# Patient Record
Sex: Female | Born: 1935 | Race: White | Hispanic: No | Smoking: Former smoker
Health system: Southern US, Community
[De-identification: ages and names within clinical notes are randomized; demographics above are authoritative.]

---

## 2007-09-14 ENCOUNTER — Ambulatory Visit: Payer: Self-pay | Admitting: Ophthalmology

## 2007-10-12 ENCOUNTER — Ambulatory Visit: Payer: Self-pay | Admitting: Ophthalmology

## 2009-04-08 ENCOUNTER — Ambulatory Visit: Payer: Self-pay | Admitting: Ophthalmology

## 2011-09-08 ENCOUNTER — Ambulatory Visit: Payer: Self-pay | Admitting: Family Medicine

## 2013-08-26 ENCOUNTER — Emergency Department: Payer: Self-pay | Admitting: Emergency Medicine

## 2013-08-26 LAB — COMPREHENSIVE METABOLIC PANEL
Alkaline Phosphatase: 105 U/L (ref 50–136)
Anion Gap: 5 — ABNORMAL LOW (ref 7–16)
BUN: 19 mg/dL — ABNORMAL HIGH (ref 7–18)
Bilirubin,Total: 0.3 mg/dL (ref 0.2–1.0)
Calcium, Total: 8.8 mg/dL (ref 8.5–10.1)
Co2: 28 mmol/L (ref 21–32)
Creatinine: 1.45 mg/dL — ABNORMAL HIGH (ref 0.60–1.30)
EGFR (African American): 40 — ABNORMAL LOW
EGFR (Non-African Amer.): 35 — ABNORMAL LOW
Glucose: 132 mg/dL — ABNORMAL HIGH (ref 65–99)
Potassium: 4.4 mmol/L (ref 3.5–5.1)
Sodium: 140 mmol/L (ref 136–145)

## 2013-08-26 LAB — CBC
HCT: 38.9 % (ref 35.0–47.0)
MCH: 27.5 pg (ref 26.0–34.0)
MCHC: 32.2 g/dL (ref 32.0–36.0)
MCV: 85 fL (ref 80–100)
RBC: 4.56 10*6/uL (ref 3.80–5.20)
RDW: 15.7 % — ABNORMAL HIGH (ref 11.5–14.5)
WBC: 8.4 10*3/uL (ref 3.6–11.0)

## 2013-08-26 LAB — URINALYSIS, COMPLETE
Bacteria: NONE SEEN
Glucose,UR: NEGATIVE mg/dL (ref 0–75)
Ketone: NEGATIVE
Nitrite: NEGATIVE
Ph: 6 (ref 4.5–8.0)
Specific Gravity: 1.011 (ref 1.003–1.030)
Squamous Epithelial: 1
WBC UR: 203 /HPF (ref 0–5)

## 2013-08-26 LAB — TROPONIN I: Troponin-I: <0.02 ng/mL

## 2013-08-28 LAB — URINE CULTURE

## 2016-04-30 ENCOUNTER — Other Ambulatory Visit: Payer: Self-pay | Admitting: Family Medicine

## 2016-04-30 ENCOUNTER — Ambulatory Visit
Admission: RE | Admit: 2016-04-30 | Discharge: 2016-04-30 | Disposition: A | Discharge status: Home / Self Care | Payer: Medicare HMO | Source: Ambulatory Visit | Attending: Family Medicine | Admitting: Family Medicine

## 2016-04-30 DIAGNOSIS — M545 Low back pain: Secondary | ICD-10-CM | POA: Insufficient documentation

## 2016-04-30 DIAGNOSIS — M5137 Other intervertebral disc degeneration, lumbosacral region: Secondary | ICD-10-CM | POA: Diagnosis not present

## 2016-04-30 DIAGNOSIS — M549 Dorsalgia, unspecified: Secondary | ICD-10-CM

## 2017-04-06 ENCOUNTER — Other Ambulatory Visit: Payer: Self-pay | Admitting: Orthopedic Surgery

## 2017-04-06 DIAGNOSIS — M5441 Lumbago with sciatica, right side: Principal | ICD-10-CM

## 2017-04-06 DIAGNOSIS — G8929 Other chronic pain: Secondary | ICD-10-CM

## 2017-04-15 ENCOUNTER — Ambulatory Visit
Admission: RE | Admit: 2017-04-15 | Discharge: 2017-04-15 | Disposition: A | Discharge status: Home / Self Care | Payer: Medicare HMO | Source: Ambulatory Visit | Attending: Orthopedic Surgery | Admitting: Orthopedic Surgery

## 2017-04-15 ENCOUNTER — Ambulatory Visit: Payer: Medicare HMO

## 2017-04-15 DIAGNOSIS — I700 Atherosclerosis of aorta: Secondary | ICD-10-CM | POA: Insufficient documentation

## 2017-04-15 DIAGNOSIS — M5136 Other intervertebral disc degeneration, lumbar region: Secondary | ICD-10-CM | POA: Diagnosis not present

## 2017-04-15 DIAGNOSIS — R188 Other ascites: Secondary | ICD-10-CM | POA: Insufficient documentation

## 2017-04-15 DIAGNOSIS — N2889 Other specified disorders of kidney and ureter: Secondary | ICD-10-CM | POA: Diagnosis not present

## 2017-04-15 DIAGNOSIS — M549 Dorsalgia, unspecified: Secondary | ICD-10-CM | POA: Diagnosis present

## 2017-04-15 DIAGNOSIS — N281 Cyst of kidney, acquired: Secondary | ICD-10-CM | POA: Insufficient documentation

## 2017-04-15 DIAGNOSIS — G8929 Other chronic pain: Secondary | ICD-10-CM | POA: Diagnosis present

## 2017-04-15 DIAGNOSIS — M5441 Lumbago with sciatica, right side: Secondary | ICD-10-CM | POA: Diagnosis present

## 2017-04-15 DIAGNOSIS — M47896 Other spondylosis, lumbar region: Secondary | ICD-10-CM | POA: Insufficient documentation

## 2017-05-13 ENCOUNTER — Emergency Department: Payer: Medicare HMO

## 2017-05-13 ENCOUNTER — Emergency Department
Admission: EM | Admit: 2017-05-13 | Discharge: 2017-05-13 | Disposition: A | Discharge status: Home / Self Care | Payer: Medicare HMO | Attending: Student in an Organized Health Care Education/Training Program | Admitting: Student in an Organized Health Care Education/Training Program

## 2017-05-13 DIAGNOSIS — Y999 Unspecified external cause status: Secondary | ICD-10-CM | POA: Diagnosis not present

## 2017-05-13 DIAGNOSIS — M545 Low back pain: Secondary | ICD-10-CM | POA: Insufficient documentation

## 2017-05-13 DIAGNOSIS — Y939 Activity, unspecified: Secondary | ICD-10-CM | POA: Insufficient documentation

## 2017-05-13 DIAGNOSIS — Z87891 Personal history of nicotine dependence: Secondary | ICD-10-CM | POA: Diagnosis not present

## 2017-05-13 DIAGNOSIS — Y92009 Unspecified place in unspecified non-institutional (private) residence as the place of occurrence of the external cause: Secondary | ICD-10-CM | POA: Insufficient documentation

## 2017-05-13 DIAGNOSIS — M546 Pain in thoracic spine: Secondary | ICD-10-CM

## 2017-05-13 DIAGNOSIS — W010XXA Fall on same level from slipping, tripping and stumbling without subsequent striking against object, initial encounter: Secondary | ICD-10-CM | POA: Insufficient documentation

## 2017-05-13 DIAGNOSIS — W19XXXA Unspecified fall, initial encounter: Secondary | ICD-10-CM

## 2017-05-13 DIAGNOSIS — G8929 Other chronic pain: Secondary | ICD-10-CM

## 2017-05-13 MED ORDER — ACETAMINOPHEN 500 MG PO TABS
1000.0000 mg | ORAL_TABLET | Freq: Once | ORAL | Status: DC
  Filled 2017-05-13: qty 2

## 2017-05-13 MED ORDER — LIDOCAINE 5 % EX PTCH
1.0000 | MEDICATED_PATCH | CUTANEOUS | Status: DC
  Administered 2017-05-13: 1 via TRANSDERMAL
  Filled 2017-05-13: qty 1

## 2017-05-13 MED ORDER — ACETAMINOPHEN 500 MG PO TABS
1000.0000 mg | ORAL_TABLET | Freq: Once | ORAL | Status: AC
  Administered 2017-05-13: 1000 mg via ORAL

## 2017-05-13 MED ORDER — CYCLOBENZAPRINE HCL 10 MG PO TABS
5.0000 mg | ORAL_TABLET | Freq: Once | ORAL | Status: AC
  Administered 2017-05-13: 5 mg via ORAL
  Filled 2017-05-13: qty 1

## 2017-05-13 MED ORDER — LIDOCAINE 5 % EX PTCH: 1.0000 | MEDICATED_PATCH | Freq: Two times a day (BID) | CUTANEOUS | 0 refills | Status: AC

## 2017-05-13 MED ORDER — CYCLOBENZAPRINE HCL 10 MG PO TABS: 5.0000 mg | ORAL_TABLET | Freq: Once | ORAL | Status: DC

## 2017-05-13 MED ORDER — TRAMADOL HCL 50 MG PO TABS: 50.0000 mg | ORAL_TABLET | Freq: Once | ORAL | Status: DC

## 2017-05-13 NOTE — Care Management Note (Signed)
Case Management Note  Patient Details  Name: Shelia ConnersDorothy E James MRN: [id number] Date of Birth: 25-Jul-1935  Subjective/Objective:        [facility] She is sleepy and does not have a preference when asked. I have made a [facility] for her at [phone], and Lorene DyChristie has accepted it. They will put her on the schedule for later today. Yellow CM card placed on the chart , and MD and RN for the pt. Made aware.            Action/Plan:   Expected Discharge Date:                  Expected Discharge Plan:     In-House Referral:     Discharge planning Services     Post Acute Care Choice:    Choice offered to:     DME Arranged:    DME Agency:     HH Arranged:    HH Agency:     Status of Service:     If discussed at [employer] Length of Stay Meetings, dates discussed:    Additional Comments:  Berna BueCheryl Mayia Megill, RN 05/13/2017, 8:01 AM

## 2017-05-13 NOTE — ED Provider Notes (Signed)
Patient doing well with walker able to ambulate slowly no complaints of pain we'll discharge as planned   Arnaldo NatalMalinda, Miliani Deike F, MD 05/13/17 [phone]

## 2017-05-13 NOTE — ED Provider Notes (Signed)
[facility] Emergency Department Provider Note    First MD Initiated Contact with Patient 05/13/17 0451     (approximate)  I have reviewed the triage vital signs and the nursing notes.   HISTORY  Chief Complaint Fall    HPI Shelia James is a 82 y.o. female presents with chief complaint of low back pain after mechanical fall at her house. Patient denies hitting her head. There is no loss of consciousness. Patient does have a history of chronic low back pain for which she gets steroid injections. Says her last injection was 2 weeks ago. States that she was moving about her house. EMS reports that she appears to have reported a lot of items and have tripped over objects in the room.  Patient denies any numbness or tingling. Just complaining of low back pain without radiation.  Reports pain as moderate to severe.   History reviewed. No pertinent past medical history. History reviewed. No pertinent family history. History reviewed. No pertinent surgical history. There are no active problems to display for this patient.     Prior to Admission medications   Medication Sig Start Date End Date Taking? Authorizing Provider  lidocaine (LIDODERM) 5 % Place 1 patch onto the skin every 12 (twelve) hours. Remove & Discard patch within 12 hours or as directed by MD 05/13/17 05/13/18  Willy Eddyobinson, Mayah Urquidi, MD    Allergies Patient has no allergy information on record.    Social History Social History  Substance Use Topics  . Smoking status: Former Games developermoker  . Smokeless tobacco: Never Used  . Alcohol use No    Review of Systems Patient denies headaches, rhinorrhea, blurry vision, numbness, shortness of breath, chest pain, edema, cough, abdominal pain, nausea, vomiting, diarrhea, dysuria, fevers, rashes or hallucinations unless otherwise stated above in HPI. ____________________________________________   PHYSICAL EXAM:  VITAL SIGNS: Vitals:   05/13/17 0530  05/13/17 0600  BP: (!) 125/54 (!) 126/54  Pulse: (!) 58 62  Resp:    Temp:      Constitutional: Alert and oriented.  in no acute distress. Eyes: Conjunctivae are normal.  Head: Atraumatic. Nose: No congestion/rhinnorhea. Mouth/Throat: Mucous membranes are moist.   Neck: No stridor. Painless ROM.  Cardiovascular: Normal rate, regular rhythm. Grossly normal heart sounds.  Good peripheral circulation. Respiratory: Normal respiratory effort.  No retractions. Lungs CTAB. Gastrointestinal: Soft and nontender. No distention. No abdominal bruits. No CVA tenderness. Musculoskeletal: No lower extremity tenderness,  2+ bilateral edema.  No joint effusions.  TTP along upper lumbar and paralumbar region  No ecchymosis or crepitus Neurologic:  Normal speech and language. No gross focal neurologic deficits are appreciated. No facial droop Skin:  Skin is warm, dry and intact. No rash noted. Psychiatric: Mood and affect are normal. Speech and behavior are normal.  ____________________________________________   LABS (all labs ordered are listed, but only abnormal results are displayed)  No results found for this or any previous visit (from the past 24 hour(s)). ____________________________________________ ____________________________________________  RADIOLOGY  I personally reviewed all radiographic images ordered to evaluate for the above acute complaints and reviewed radiology reports and findings.  These findings were personally discussed with the patient.  Please see medical record for radiology report.  ____________________________________________   PROCEDURES  Procedure(s) performed:  Procedures    Critical Care performed: no ____________________________________________   INITIAL IMPRESSION / ASSESSMENT AND PLAN / ED COURSE  Pertinent labs & imaging results that were available during my care of the patient were reviewed  by me and considered in my medical decision making (see  chart for details).  DDX: fracture, contusion, spasm, dislocation  Shelia James is a 82 y.o. who presents to the ED with mechanical fall. No head injury. Patient with history of chronic back pain and is reporting worsening lower thoracic and lumbar back pain. Patient denies any chest pain or shortness of breath. No syncopal or presyncopal symptoms. We'll order plain radiographs to evaluate for fracture. Patient otherwise well appearing and in no acute distress.  Clinical Course as of May 13 709  Thu May 13, 2017  [id number] Reassessment patient states she does feel better after medications given thus far however is still having some discomfort when taking a deep breath in her low back. Based on her age and limited evaluation of ribs on chest x-ray will order CT imaging to evaluate for evidence of rib fractures.  [PR]  0708 CT imaging pending. Patient will be signed out to Dr. Juliette AlcideMelinda pending ambulation trial. Patient has been given referral to home health for home PT.  Have discussed with the patient and available family all diagnostics and treatments performed thus far and all questions were answered to the best of my ability. The patient demonstrates understanding and agreement with plan.   [PR]    Clinical Course User Index [PR] Willy Eddyobinson, Guliana Weyandt, MD     ____________________________________________   FINAL CLINICAL IMPRESSION(S) / ED DIAGNOSES  Final diagnoses:  Fall, initial encounter  Chronic midline thoracic back pain      NEW MEDICATIONS STARTED DURING THIS VISIT:  New Prescriptions   LIDOCAINE (LIDODERM) 5 %    Place 1 patch onto the skin every 12 (twelve) hours. Remove & Discard patch within 12 hours or as directed by MD     Note:  This document was prepared using Dragon voice recognition software and may include unintentional dictation errors.    Willy Eddyobinson, Demarie Uhlig, MD 05/13/17 [phone]

## 2017-05-13 NOTE — ED Triage Notes (Signed)
PT arrived via EMS from home. EMS stated that she lost her balance in the kitchen and fell hitting her back. Lower lumbar pain. Pt states that 2wks ago she had a cortisone shot in the same area. Pt states she did not hit her head. No IV. BP-140s/70s HR-78 O2-98%RA. NKA. Hx of COPD and CHF. Medications are bedside in a black bag with a zipper.

## 2017-05-13 NOTE — ED Notes (Signed)
Pt ambulated in hall with [place], [place]. Pt gait slow but steady with walker.

## 2017-05-17 ENCOUNTER — Encounter: Payer: Self-pay | Admitting: Emergency Medicine

## 2017-05-17 ENCOUNTER — Emergency Department
Admission: EM | Admit: 2017-05-17 | Discharge: 2017-05-17 | Disposition: A | Discharge status: Home / Self Care | Payer: Medicare HMO | Attending: Emergency Medicine | Admitting: Emergency Medicine

## 2017-05-17 DIAGNOSIS — Z79899 Other long term (current) drug therapy: Secondary | ICD-10-CM | POA: Diagnosis not present

## 2017-05-17 DIAGNOSIS — M545 Low back pain, unspecified: Secondary | ICD-10-CM

## 2017-05-17 DIAGNOSIS — Z87891 Personal history of nicotine dependence: Secondary | ICD-10-CM | POA: Diagnosis not present

## 2017-05-17 DIAGNOSIS — M62830 Muscle spasm of back: Secondary | ICD-10-CM | POA: Diagnosis not present

## 2017-05-17 MED ORDER — DIAZEPAM 5 MG PO TABS
5.0000 mg | ORAL_TABLET | Freq: Once | ORAL | Status: AC
  Administered 2017-05-17: 5 mg via ORAL
  Filled 2017-05-17: qty 1

## 2017-05-17 MED ORDER — PREDNISONE 10 MG (21) PO TBPK: ORAL_TABLET | ORAL | 0 refills | Status: AC

## 2017-05-17 MED ORDER — LIDOCAINE HCL (PF) 1 % IJ SOLN
10.0000 mL | Freq: Once | INTRAMUSCULAR | Status: AC
  Administered 2017-05-17: 10 mL via INTRADERMAL
  Filled 2017-05-17: qty 10

## 2017-05-17 MED ORDER — FENTANYL CITRATE (PF) 100 MCG/2ML IJ SOLN
50.0000 ug | Freq: Once | INTRAMUSCULAR | Status: AC
  Administered 2017-05-17: 50 ug via INTRAMUSCULAR
  Filled 2017-05-17: qty 2

## 2017-05-17 MED ORDER — TRIAMCINOLONE ACETONIDE 40 MG/ML IJ SUSP
80.0000 mg | Freq: Once | INTRAMUSCULAR | Status: AC
  Administered 2017-05-17: 80 mg via INTRAMUSCULAR
  Filled 2017-05-17 (×2): qty 2

## 2017-05-17 MED ORDER — TRAMADOL HCL 50 MG PO TABS: 50.0000 mg | ORAL_TABLET | Freq: Four times a day (QID) | ORAL | 0 refills | Status: AC | PRN

## 2017-05-17 MED ORDER — DIAZEPAM 2 MG PO TABS
2.0000 mg | ORAL_TABLET | Freq: Once | ORAL | Status: AC
  Administered 2017-05-17: 2 mg via ORAL
  Filled 2017-05-17: qty 1

## 2017-05-17 MED ORDER — DEXAMETHASONE SODIUM PHOSPHATE 10 MG/ML IJ SOLN
10.0000 mg | Freq: Once | INTRAMUSCULAR | Status: AC
  Administered 2017-05-17: 10 mg via INTRAMUSCULAR
  Filled 2017-05-17: qty 1

## 2017-05-17 NOTE — ED Provider Notes (Signed)
[facility] Emergency Department Provider Note   ____________________________________________   I have reviewed the triage vital signs and the nursing notes.   HISTORY  Chief Complaint Back Pain    HPI Shelia James is a 82 y.o. female presents to the emergency department with left-sided lumbar pain and severe muscle spasms that have significantly worsened since she was seen on 8/2. Patient reports sustained pain and spasming that increases with movement and walking posture. Patient denies any additional injuries or falls since being seen on 8/2. Patient describes pain as sharp, cramping and movement limiting. Patient reports being given a prescription for Lidoderm patches for current symptoms however she was unable to afford the prescription due to Lidoderm is not covered under her current prescription plan. Patient denies any radicular symptoms, bowel or bladder dysfunction or saddle anesthesia.   Patient denies fever, chills, headache, vision changes, chest pain, chest tightness, shortness of breath, abdominal pain, nausea and vomiting.  History reviewed. No pertinent past medical history.  There are no active problems to display for this patient.   History reviewed. No pertinent surgical history.  Prior to Admission medications   Medication Sig Start Date End Date Taking? Authorizing Provider  hydrOXYzine (VISTARIL) 25 MG capsule Take 25 mg by mouth 2 (two) times daily.    [provider]  lidocaine (LIDODERM) 5 % Place 1 patch onto the skin every 12 (twelve) hours. Remove & Discard patch within 12 hours or as directed by MD 05/13/17 05/13/18  Willy Eddy, MD  oxybutynin (DITROPAN) 5 MG tablet Take 5 mg by mouth 3 (three) times daily.    [provider]  predniSONE (STERAPRED UNI-PAK 21 TAB) 10 MG (21) TBPK tablet Take 6 tablets on day 1. Take 5 tablets on day 2. Take 4 tablets on day 3. Take 3 tablets on day 4. Take 2 tablets  on day 5. Take 1 tablets on day 6. 05/17/17   Tiwanna Tuch M, PA-C  rOPINIRole (REQUIP) 2 MG tablet Take 2 mg by mouth 2 (two) times daily.    [provider]  tiotropium (SPIRIVA) 18 MCG inhalation capsule Place 18 mcg into inhaler and inhale daily.    [provider]  traMADol (ULTRAM) 50 MG tablet Take 1 tablet (50 mg total) by mouth every 6 (six) hours as needed. 05/17/17 05/17/18  Eldred Sooy M, PA-C  vitamin B-12 (CYANOCOBALAMIN) 1000 MCG tablet Take 1,000 mcg by mouth daily.    [provider]    Allergies Patient has no known allergies.  History reviewed. No pertinent family history.  Social History Social History  Substance Use Topics  . Smoking status: Former Games developer  . Smokeless tobacco: Never Used  . Alcohol use No     Review of Systems Constitutional: Negative for fever/chills Eyes: No visual changes. ENT:  Negative for sore throat and for difficulty swallowing Cardiovascular: Denies chest pain. Respiratory: Denies cough. Denies shortness of breath. Gastrointestinal: No abdominal pain.  No nausea, vomiting, diarrhea. Genitourinary: Negative for dysuria. Musculoskeletal: Positive for right side lower thoracic and upper lumbar pain with severe muscle spasms.  Skin: Negative for rash. Neurological: Negative for headaches. Able to ambulate.   ____________________________________________   PHYSICAL EXAM:  VITAL SIGNS:  Patient Vitals for the past 24 hrs:  BP Temp Temp src Pulse Resp SpO2 Weight  05/17/17 1913 (!) 140/55 - - 70 16 90 % -  05/17/17 1439 (!) 130/49 97.6 F (36.4 C) Oral 75 16 97 % 104.3 kg (  230 lb)    Constitutional: Alert and oriented. Well appearing and in no acute distress.  Eyes: Conjunctivae are normal. PERRL. EOMI  Head: Normocephalic and atraumatic. ENT: Ears:Canals clear. TMs intact bilaterally. Nose: No congestion/rhinnorhea. Mouth/Throat: Mucous membranes are moist. Neck:Supple. No  thyromegaly. No stridor. Cardiovascular: Normal rate, regular rhythm. Normal S1 and S2. Good peripheral circulation. Respiratory: Normal respiratory effort without tachypnea or retractions. Lungs CTAB. Good air entry to the bases with no decreased or absent breath sounds. Hematological/Lymphatic/Immunological: No cervical lymphadenopathy. Cardiovascular: Normal rate, regular rhythm. Normal distal pulses. Respiratory: Normal respiratory effort. No wheezes/rales/rhonchi. Lungs CTAB with no W/R/R. Gastrointestinal: Bowel sounds 4 quadrants. Soft and nontender to palpation. No guarding or rigidity. No palpable masses. No distention. No CVA tenderness. Musculoskeletal: Lower thoracic and upper lumbar pain with palpable paraspinal muscle tenderness. Palpable spasms. Antalgic posturing secondary to pain and spasms. Nontender with normal range of motion in all extremities. Neurologic: Normal speech and language. No gross focal neurologic deficits are appreciated. No gait instability. No sensory loss or abnormal reflexes.  Skin:  Skin is warm, dry and intact. No rash noted. Psychiatric:Mood and affect are normal. Speech and behavior are normal. Patient exhibits  ____________________________________________   LABS (all labs ordered are listed, but only abnormal results are displayed)  Labs Reviewed - No data to display ____________________________________________  EKG none ____________________________________________  RADIOLOGY none ____________________________________________   PROCEDURES  Procedure(s) performed:  Trigger point injections:  Performed by: Clois Comber Consent: Verbal consent obtained. Risks and benefits: risks, benefits and alternatives were discussed  Kenalog 40 mg/ml and Lidocaine 2% 5 ml: 6 ml total on each side of thoracic longissimus. (3) trigger point injections along each side chosen based on palpation of trigger points.  Patient tolerance: Patient tolerated  the procedure well with no immediate complications.    Critical Care performed: no ____________________________________________   INITIAL IMPRESSION / ASSESSMENT AND PLAN / ED COURSE  Pertinent labs & imaging results that were available during my care of the patient were reviewed by me and considered in my medical decision making (see chart for details).    Patient presents to emergency department with lower thoracic and upper lumbar pain with severe muscle spasms without additional traumatic injury.  History and physical exam findings are reassuring symptoms are consistent with thoracic and lumbar strain with muscle spasms.. Patient noted improvement of symptoms and improved ease of mobility following treatment provided during the course of care in the emergency department. Patient will be prescribed  prednisone taper and tramadol pain as needed. Patient advised to follow up with PCP as needed or return to the emergency department if symptoms return or worsen.    ____________________________________________   FINAL CLINICAL IMPRESSION(S) / ED DIAGNOSES  Final diagnoses:  Muscle spasm of back  Acute midline low back pain without sciatica       NEW MEDICATIONS STARTED DURING THIS VISIT:  Discharge Medication List as of 05/17/2017  6:59 PM    START taking these medications   Details  predniSONE (STERAPRED UNI-PAK 21 TAB) 10 MG (21) TBPK tablet Take 6 tablets on day 1. Take 5 tablets on day 2. Take 4 tablets on day 3. Take 3 tablets on day 4. Take 2 tablets on day 5. Take 1 tablets on day 6., Print    traMADol (ULTRAM) 50 MG tablet Take 1 tablet (50 mg total) by mouth every 6 (six) hours as needed., Starting Mon 05/17/2017, Until Tue 05/17/2018, Print  Note:  This document was prepared using Dragon voice recognition software and may include unintentional dictation errors.    Jahvier Aldea, Jordan Likesraci M, PA-C 05/17/17 2223    Sharyn CreamerQuale, Mark, MD 05/18/17 [id number]

## 2017-05-17 NOTE — Discharge Instructions (Signed)
Return to emergency department if symptoms worsen and follow-up with PCP as needed.   °

## 2017-05-17 NOTE — ED Triage Notes (Signed)
Pt to ed with c/o back pain since falling about 4 days ago.  Pt reports she was seen here in ed but was given pain patch RX that cost 100$ out of pocket for her.  Pt reports pain is worse with movement and standing.

## 2017-05-17 NOTE — ED Notes (Signed)
Pt states she was seen in ED last week after a fall, states not feeling any better, states general back pain and tailbone pain, awake and alert, family at bedside

## 2017-09-09 IMAGING — CT CT CHEST W/O CM
2 of 3 series · 15 of 36 positions shown, 18 images · non-contrast
Comparison: Radiography same day.  MRI 04/15/2017

CLINICAL DATA: Fell at home in the kitchen striking the back.
Question chest wall injury.

EXAM:
CT CHEST WITHOUT CONTRAST
TECHNIQUE: Multidetector CT imaging of the chest was performed following the
standard protocol without IV contrast.

[Series 2: thorax · axial · 0.65mm/px · z∈[-874,-574]mm · 12 of 176 slices shown, 15 images]
[im 13/176  mediastinal]
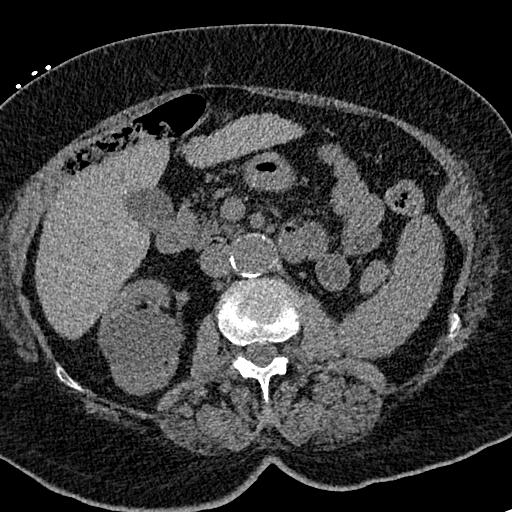
[im 13/176  lung]
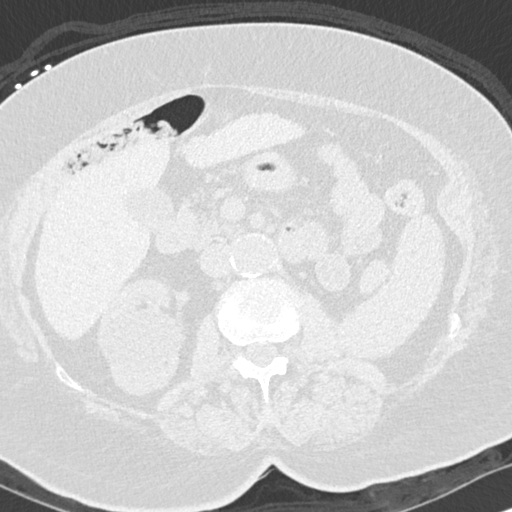
[im 26/176  lung]
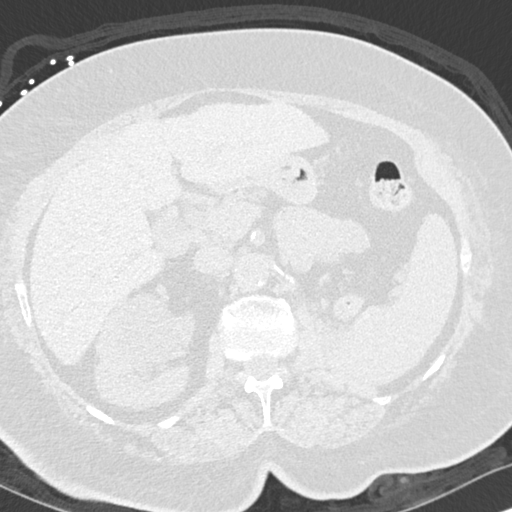
[im 39/176  lung]
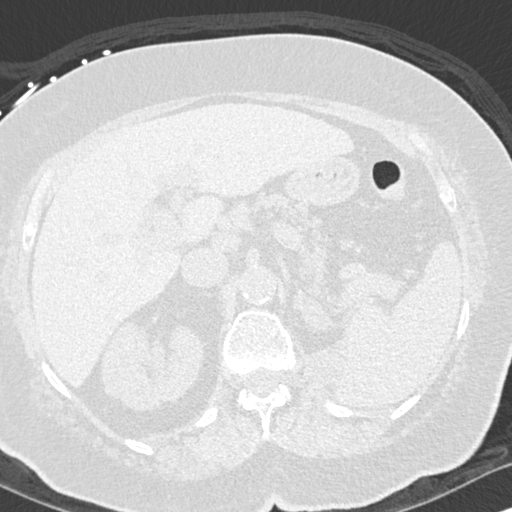
[im 52/176  lung]
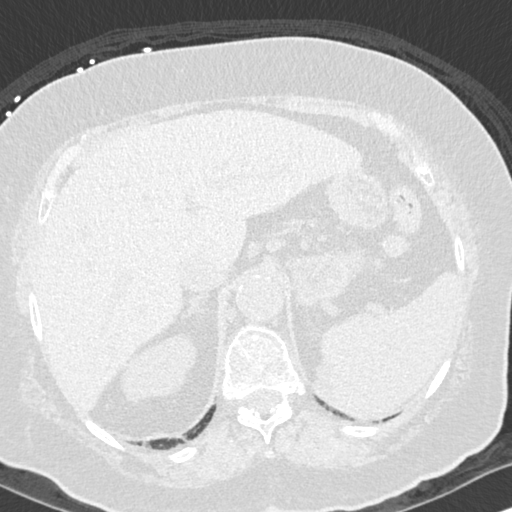
[im 65/176  mediastinal]
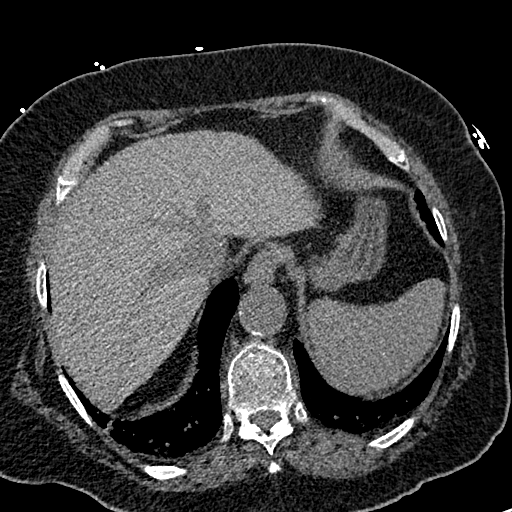
[im 65/176  lung]
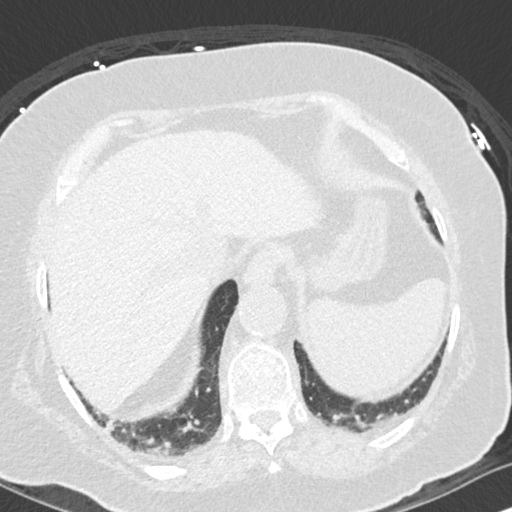
[im 78/176  lung]
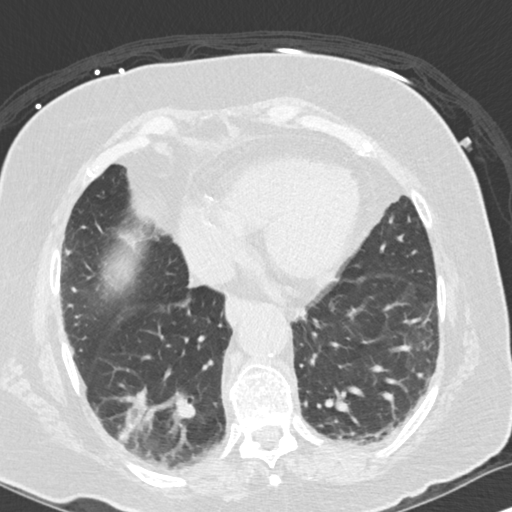
[im 98/176  lung]
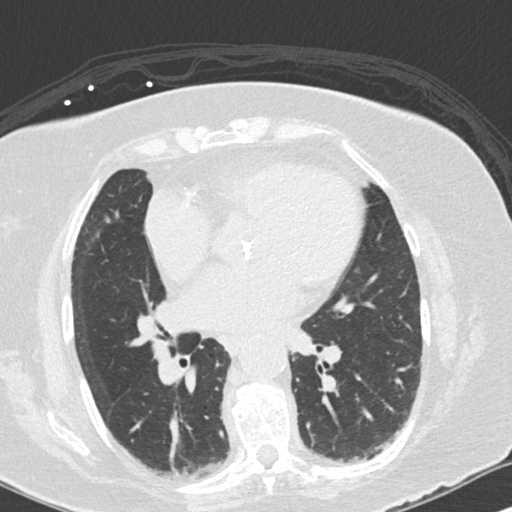
[im 111/176  lung]
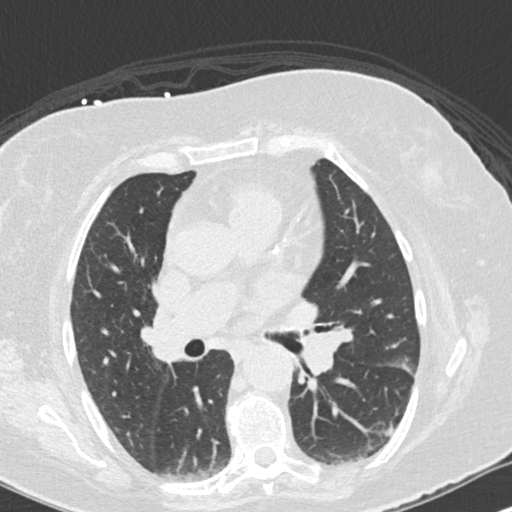
[im 124/176  mediastinal]
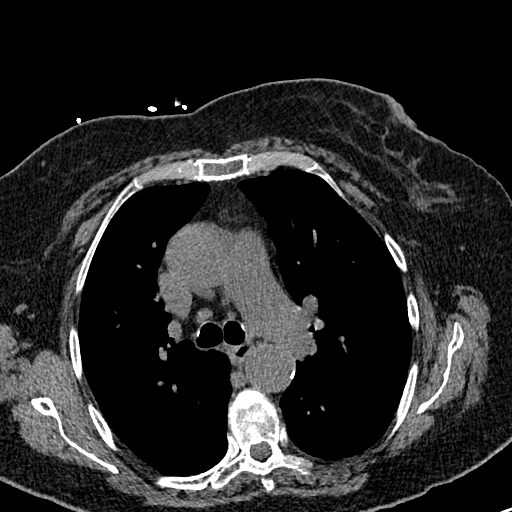
[im 124/176  lung]
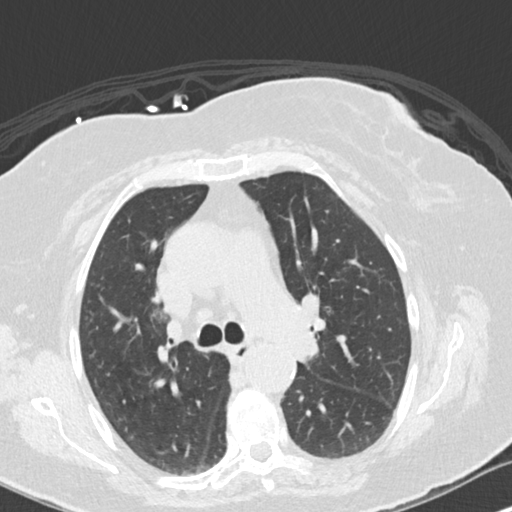
[im 137/176  lung]
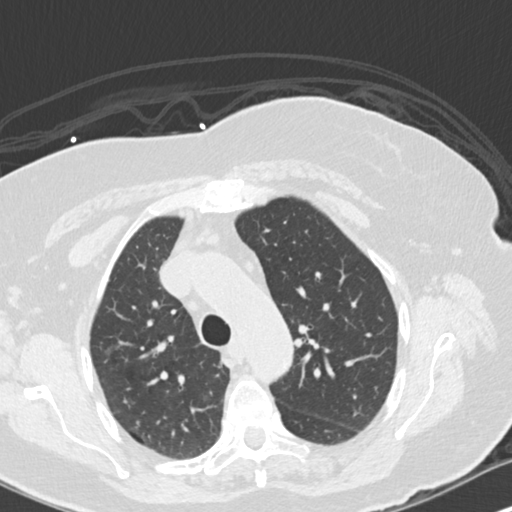
[im 150/176  lung]
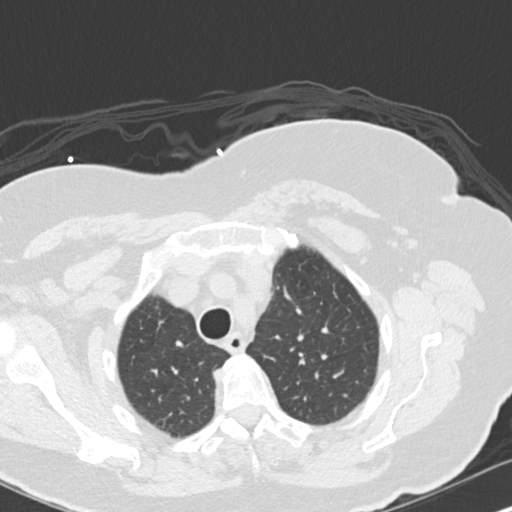
[im 163/176  lung]
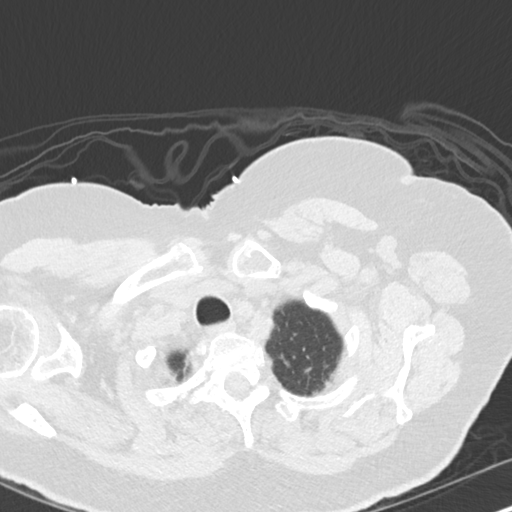

[Series 6: coronal · coronal · 0.71mm/px · 3 of 164 slices shown]
[im 33/164  lung]
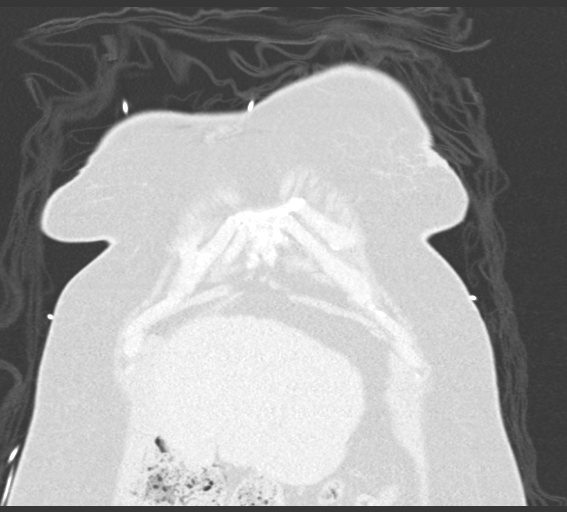
[im 66/164  lung]
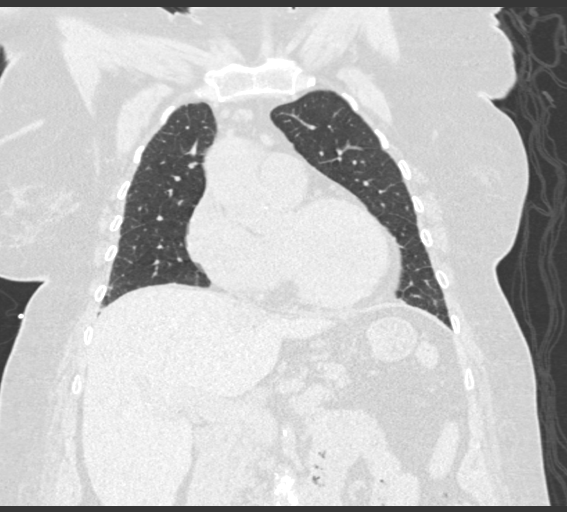
[im 98/164  lung]
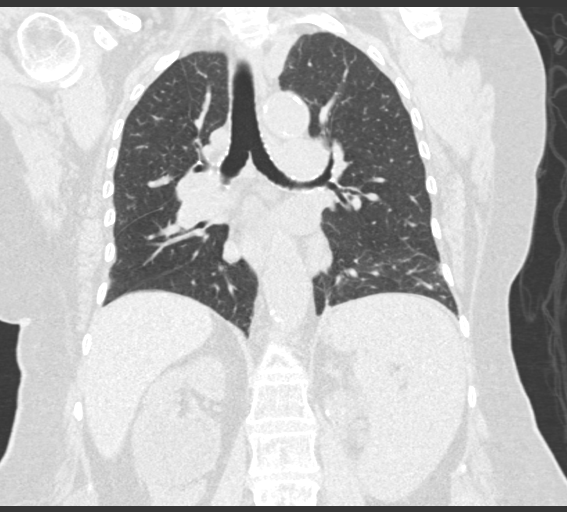

[15 of 36 positions shown; findings below may reference images not displayed]

FINDINGS: Cardiovascular: Aortic atherosclerosis. Coronary artery
calcification. Mild cardiomegaly. No pericardial fluid.

Mediastinum/Nodes: No mass or lymphadenopathy. Normal mediastinal
lymph nodes.

Lungs/Pleura: Mild pleural and parenchymal scarring at the apices.
Mild emphysema in the mid and lower lungs. No pneumothorax or
hemothorax.

Upper Abdomen: No acute upper abdominal finding. Right renal cyst
not completely characterized. Left adrenal mass with some
calcifications measuring up to 2.5 cm in size. Small amount of
material adjacent to the medial margin of the spleen that was
visible on the previous MRI and could represent a small amount of
loculated fluid, possibly related to previous left nephrectomy.

Musculoskeletal: No evidence of old or acute rib fracture. No
evidence of acute thoracic spinal fracture. Mild chronic appearing
loss of height of midthoracic vertebral bodies. No sternal fracture.
IMPRESSION: No traumatic chest finding. No evidence of regional fracture. No
pneumothorax or hemothorax.

Pulmonary scarring and mild emphysematous change.

Left adrenal mass with calcification measuring up to 2.5 cm. Density
not sufficiently low for diagnosis of adenoma. Further evaluation
with MRI or contrast-enhanced CT suggested at some point unless
there is prior outside imaging.

Absent left kidney, presumably post nephrectomy.

Small amount of fluid density material adjacent to the medial margin
of the spleen without evidence of splenic parenchymal lesion. This
was present on an MRI study of 1 month ago and therefore would not
relate to the recent trauma.

Aortic Atherosclerosis (Q7936-2DV.V) and Emphysema (Q7936-06U.G).
Some coronary artery calcification as well.

## 2018-06-12 DEATH — deceased
